# Patient Record
Sex: Male | Born: 1965 | Race: Asian | Hispanic: No | Marital: Married | Smoking: Never smoker
Health system: Southern US, Community
[De-identification: ages and names within clinical notes are randomized; demographics above are authoritative.]

---

## 2005-11-15 ENCOUNTER — Emergency Department (HOSPITAL_COMMUNITY): Admission: EM | Admit: 2005-11-15 | Discharge: 2005-11-15 | Payer: Self-pay | Admitting: Family Medicine

## 2006-01-20 ENCOUNTER — Emergency Department (HOSPITAL_COMMUNITY): Admission: EM | Admit: 2006-01-20 | Discharge: 2006-01-21 | Payer: Self-pay | Admitting: Family Medicine

## 2006-01-23 ENCOUNTER — Emergency Department (HOSPITAL_COMMUNITY): Admission: EM | Admit: 2006-01-23 | Discharge: 2006-01-23 | Payer: Self-pay | Admitting: Emergency Medicine
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
## 2014-12-07 ENCOUNTER — Emergency Department (HOSPITAL_COMMUNITY)
Admission: EM | Admit: 2014-12-07 | Discharge: 2014-12-07 | Disposition: A | Discharge status: Home / Self Care | Payer: No Typology Code available for payment source | Attending: Emergency Medicine | Admitting: Emergency Medicine

## 2014-12-07 DIAGNOSIS — S0990XA Unspecified injury of head, initial encounter: Secondary | ICD-10-CM | POA: Insufficient documentation

## 2014-12-07 DIAGNOSIS — Y998 Other external cause status: Secondary | ICD-10-CM | POA: Diagnosis not present

## 2014-12-07 DIAGNOSIS — Y92410 Unspecified street and highway as the place of occurrence of the external cause: Secondary | ICD-10-CM | POA: Diagnosis not present

## 2014-12-07 DIAGNOSIS — Y9389 Activity, other specified: Secondary | ICD-10-CM | POA: Insufficient documentation

## 2014-12-07 MED ORDER — IBUPROFEN 800 MG PO TABS
800.0000 mg | ORAL_TABLET | Freq: Once | ORAL | Status: AC
  Administered 2014-12-07: 800 mg via ORAL
  Filled 2014-12-07: qty 1

## 2014-12-07 MED ORDER — IBUPROFEN 600 MG PO TABS: 600.0000 mg | ORAL_TABLET | Freq: Four times a day (QID) | ORAL | Status: DC | PRN
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
## 2017-12-12 ENCOUNTER — Encounter (HOSPITAL_COMMUNITY): Payer: Self-pay | Admitting: Emergency Medicine

## 2017-12-12 ENCOUNTER — Other Ambulatory Visit: Payer: Self-pay

## 2017-12-12 ENCOUNTER — Emergency Department (HOSPITAL_COMMUNITY)
Admission: EM | Admit: 2017-12-12 | Discharge: 2017-12-12 | Disposition: A | Discharge status: Home / Self Care | Payer: 59 | Attending: Emergency Medicine | Admitting: Emergency Medicine

## 2017-12-12 DIAGNOSIS — Y9389 Activity, other specified: Secondary | ICD-10-CM | POA: Insufficient documentation

## 2017-12-12 DIAGNOSIS — Y33XXXA Other specified events, undetermined intent, initial encounter: Secondary | ICD-10-CM | POA: Diagnosis not present

## 2017-12-12 DIAGNOSIS — Y998 Other external cause status: Secondary | ICD-10-CM | POA: Diagnosis not present

## 2017-12-12 DIAGNOSIS — S0501XA Injury of conjunctiva and corneal abrasion without foreign body, right eye, initial encounter: Secondary | ICD-10-CM | POA: Diagnosis present

## 2017-12-12 DIAGNOSIS — Y929 Unspecified place or not applicable: Secondary | ICD-10-CM | POA: Diagnosis not present

## 2017-12-12 MED ORDER — GENTAMICIN SULFATE 0.3 % OP SOLN: 1.0000 [drp] | OPHTHALMIC | 0 refills | Status: DC

## 2017-12-12 MED ORDER — TETRACAINE HCL 0.5 % OP SOLN
1.0000 [drp] | Freq: Once | OPHTHALMIC | Status: AC
  Administered 2017-12-12: 1 [drp] via OPHTHALMIC
  Filled 2017-12-12: qty 4

## 2017-12-12 MED ORDER — FLUORESCEIN SODIUM 1 MG OP STRP
1.0000 | ORAL_STRIP | Freq: Once | OPHTHALMIC | Status: AC
  Administered 2017-12-12: 1 via OPHTHALMIC
  Filled 2017-12-12: qty 1

## 2017-12-12 NOTE — Discharge Instructions (Signed)
Apply eyedrops to right eye as prescribed every 4 hours while awake. Follow-up with the eye doctor for recheck. Return to ER for worsening or concerning symptoms.

## 2017-12-12 NOTE — ED Provider Notes (Signed)
[facility] EMERGENCY DEPARTMENT Provider Note   CSN: [id number] Arrival date & time: 12/12/17  0907     History   Chief Complaint Chief Complaint  Patient presents with  . Eye Problem    HPI Stanley Cowan is a 52 y.o. male.  52 year old male presents with complaint of right eye injury.  Patient states that he was digging up a tree yesterday when he got a bunch of dirt in his eye, feels like he still has dirt in the eye now.  Denies light sensitivity or changes in vision, does not wear glasses or contacts.  No other injuries, complaints, concerns.     History reviewed. No pertinent past medical history.  There are no active problems to display for this patient.   History reviewed. No pertinent surgical history.      Home Medications    Prior to Admission medications   Medication Sig Start Date End Date Taking? Authorizing Provider  gentamicin (GARAMYCIN) 0.3 % ophthalmic solution Place 1 drop into the right eye every 4 (four) hours. 12/12/17   Jeannie Fend, PA-C  ibuprofen (ADVIL,MOTRIN) 600 MG tablet Take 1 tablet (600 mg total) by mouth every 6 (six) hours as needed. 12/07/14   Joycie Peek, PA-C    Family History No family history on file.  Social History Social History   Tobacco Use  . Smoking status: Not on file  Substance Use Topics  . Alcohol use: Not on file  . Drug use: Not on file     Allergies   Patient has no known allergies.   Review of Systems Review of Systems  Constitutional: Negative for fever.  HENT: Negative for congestion.   Eyes: Positive for pain and redness. Negative for photophobia, discharge, itching and visual disturbance.  Skin: Negative for rash and wound.  Allergic/Immunologic: Negative for immunocompromised state.  Neurological: Negative for dizziness and headaches.  Hematological: Negative for adenopathy. Does not bruise/bleed easily.  All other systems reviewed and are negative.    Physical  Exam Updated Vital Signs BP 135/82 (BP Location: Right Arm)   Pulse (!) 59   Temp 98 F (36.7 C) (Oral)   Resp 16   SpO2 98%   Physical Exam  Constitutional: He is oriented to person, place, and time. He appears well-developed and well-nourished. No distress.  HENT:  Head: Normocephalic and atraumatic.  Eyes: Pupils are equal, round, and reactive to light. EOM are normal. Lids are everted and swept, no foreign bodies found. Right conjunctiva is injected. Right conjunctiva has no hemorrhage. Left conjunctiva is not injected. Left conjunctiva has no hemorrhage. Right eye exhibits normal extraocular motion. Left eye exhibits normal extraocular motion.  Slit lamp exam:      The right eye shows fluorescein uptake. The right eye shows no foreign body.    Pulmonary/Chest: Effort normal.  Neurological: He is alert and oriented to person, place, and time.  Skin: He is not diaphoretic.  Psychiatric: He has a normal mood and affect. His behavior is normal.  Nursing note and vitals reviewed.    ED Treatments / Results  Labs (all labs ordered are listed, but only abnormal results are displayed) Labs Reviewed - No data to display  EKG None  Radiology No results found.  Procedures Procedures (including critical care time)  Medications Ordered in ED Medications  tetracaine (PONTOCAINE) 0.5 % ophthalmic solution 1 drop (1 drop Both Eyes Given by Other 12/12/17 1037)  fluorescein ophthalmic strip 1 strip (1 strip Both  Eyes Given by Other 12/12/17 1037)     Initial Impression / Assessment and Plan / ED Course  I have reviewed the triage vital signs and the nursing notes.  Pertinent labs & imaging results that were available during my care of the patient were reviewed by me and considered in my medical decision making (see chart for details).  Clinical Course as of Dec 12 1098  Mon Dec 12, 2017  2836 52 year old male presents with complaint of right eye pain and foreign body sensation  after getting in his eye yesterday working.  Visual acuity without significant changes, watery drainage, injected conjunctiva, on Woods lamp exam patient has fluorescein uptake at the 9 o'clock position consistent with corneal abrasion, no streaming.  There is mild swelling of the right upper lid without erythema.  Patient was discharged with gentamicin drops for corneal abrasion with referral to ophthalmology for recheck follow-up.  Return to ER for any worsening or concerning symptoms.   [LM]    Clinical Course User Index [LM] Jeannie Fend, PA-C    Final Clinical Impressions(s) / ED Diagnoses   Final diagnoses:  Abrasion of right cornea, initial encounter    ED Discharge Orders         Ordered    gentamicin (GARAMYCIN) 0.3 % ophthalmic solution  Every 4 hours     12/12/17 1056           Jeannie Fend, PA-C 12/12/17 1100    Long, Arlyss Repress, MD 12/12/17 1950

## 2017-12-12 NOTE — ED Triage Notes (Signed)
Pt was working yesterday evening when he got something in his eye. Right eye is swollen shut and watering.

## 2018-09-06 ENCOUNTER — Other Ambulatory Visit: Payer: Self-pay

## 2018-09-06 ENCOUNTER — Emergency Department (HOSPITAL_COMMUNITY)
Admission: EM | Admit: 2018-09-06 | Discharge: 2018-09-06 | Disposition: A | Discharge status: Home / Self Care | Payer: 59 | Attending: Emergency Medicine | Admitting: Emergency Medicine

## 2018-09-06 DIAGNOSIS — R509 Fever, unspecified: Secondary | ICD-10-CM | POA: Insufficient documentation

## 2018-09-06 DIAGNOSIS — Z0279 Encounter for issue of other medical certificate: Secondary | ICD-10-CM | POA: Insufficient documentation

## 2018-09-06 DIAGNOSIS — R51 Headache: Secondary | ICD-10-CM | POA: Insufficient documentation

## 2018-09-06 DIAGNOSIS — Z139 Encounter for screening, unspecified: Secondary | ICD-10-CM

## 2018-09-06 NOTE — ED Provider Notes (Signed)
[facility]   CSN: [id number] Arrival date & time: 09/06/18  1240    History   Chief Complaint Chief Complaint  Patient presents with  . Fever    HPI Stanley Cowan is a 53 y.o. male.     HPI [place] interpreter used  53 year old Stanley Cowan E speaking male presents today with complaints of fever.  Patient notes he had a fever yesterday.  He is not sure how high because he did not measure his temperature but he felt hot.  He notes he had a generalized headache.  He notes taking over-the-counter medication which resolved his symptoms completely.  Patient notes no fever headache this morning.  He denies any neck stiffness, denies any cough, shortness of breath, sore throat, rhinorrhea or nasal congestion.  She was referred to the emergency room from his workplace for a work note.  No past medical history on file.  There are no active problems to display for this patient.   No past surgical history on file.      Home Medications    Prior to Admission medications   Medication Sig Start Date End Date Taking? Authorizing Provider  gentamicin (GARAMYCIN) 0.3 % ophthalmic solution Place 1 drop into the right eye every 4 (four) hours. 12/12/17   Jeannie FendMurphy, Laura A, PA-C  ibuprofen (ADVIL,MOTRIN) 600 MG tablet Take 1 tablet (600 mg total) by mouth every 6 (six) hours as needed. 12/07/14   Joycie Peekartner, Benjamin, PA-C    Family History No family history on file.  Social History Social History   Tobacco Use  . Smoking status: Not on file  Substance Use Topics  . Alcohol use: Not on file  . Drug use: Not on file     Allergies   Patient has no known allergies.   Review of Systems Review of Systems  All other systems reviewed and are negative.    Physical Exam Updated Vital Signs BP (!) 152/89 (BP Location: Left Arm)   Pulse 68   Temp 98.6 F (37 C) (Oral)   Resp 18   SpO2 99%   Physical Exam Vitals signs and nursing note  reviewed.  Constitutional:      Appearance: He is well-developed.  HENT:     Head: Normocephalic and atraumatic.  Eyes:     General: No scleral icterus.       Right eye: No discharge.        Left eye: No discharge.     Conjunctiva/sclera: Conjunctivae normal.     Pupils: Pupils are equal, round, and reactive to light.  Neck:     Musculoskeletal: Normal range of motion.     Vascular: No JVD.     Trachea: No tracheal deviation.     Comments: Neck supple full active range of motion Pulmonary:     Effort: Pulmonary effort is normal.     Breath sounds: No stridor.  Neurological:     Mental Status: He is alert and oriented to person, place, and time.     Coordination: Coordination normal.  Psychiatric:        Behavior: Behavior normal.        Thought Content: Thought content normal.        Judgment: Judgment normal.      ED Treatments / Results  Labs (all labs ordered are listed, but only abnormal results are displayed) Labs Reviewed - No data to display  EKG None  Radiology No results found.  Procedures Procedures (  including critical care time)  Medications Ordered in ED Medications - No data to display   Initial Impression / Assessment and Plan / ED Course  I have reviewed the triage vital signs and the nursing notes.  Pertinent labs & imaging results that were available during my care of the patient were reviewed by me and considered in my medical decision making (see chart for details).        Labs:   Imaging:  Consults:  Therapeutics:  Discharge Meds:   Assessment/Plan: 53 year old male is here for work note.  He has no signs or symptoms consistent with any infectious etiology.  Reports headache and subjective fever yesterday that has completely resolved.  Patient needs no further evaluation and manage at this time.  Return precautions given.   Final Clinical Impressions(s) / ED Diagnoses   Final diagnoses:  Encounter for screening    ED  Discharge Orders    None       Rosalio Loud 09/06/18 1832    Geoffery Lyons, MD 09/07/18 2132

## 2018-09-06 NOTE — ED Triage Notes (Signed)
Per translator phone- pt has had headache, fevers since yesterday. Pt states "I don't feel right" pt reports taking dayquil and ibuprofen. pt reports improvement sense.

## 2018-09-08 ENCOUNTER — Other Ambulatory Visit: Payer: Self-pay

## 2018-09-08 ENCOUNTER — Encounter (HOSPITAL_COMMUNITY): Payer: Self-pay | Admitting: Emergency Medicine

## 2018-09-08 ENCOUNTER — Emergency Department (HOSPITAL_COMMUNITY)
Admission: EM | Admit: 2018-09-08 | Discharge: 2018-09-08 | Disposition: A | Discharge status: Home / Self Care | Payer: 59 | Attending: Emergency Medicine | Admitting: Emergency Medicine

## 2018-09-08 ENCOUNTER — Emergency Department (HOSPITAL_COMMUNITY): Payer: 59

## 2018-09-08 DIAGNOSIS — M791 Myalgia, unspecified site: Secondary | ICD-10-CM | POA: Diagnosis present

## 2018-09-08 DIAGNOSIS — R509 Fever, unspecified: Secondary | ICD-10-CM | POA: Diagnosis not present

## 2018-09-08 DIAGNOSIS — U071 COVID-19: Secondary | ICD-10-CM | POA: Insufficient documentation

## 2018-09-08 LAB — CBC WITH DIFFERENTIAL/PLATELET
Abs Immature Granulocytes: 0 10*3/uL (ref 0.00–0.07)
Basophils Absolute: 0 10*3/uL (ref 0.0–0.1)
Basophils Relative: 0 %
Eosinophils Absolute: 0 10*3/uL (ref 0.0–0.5)
Eosinophils Relative: 0 %
HCT: 48.3 % (ref 39.0–52.0)
Hemoglobin: 15.8 g/dL (ref 13.0–17.0)
Immature Granulocytes: 0 %
Lymphocytes Relative: 24 %
Lymphs Abs: 0.9 10*3/uL (ref 0.7–4.0)
MCH: 26.5 pg (ref 26.0–34.0)
MCHC: 32.7 g/dL (ref 30.0–36.0)
MCV: 81 fL (ref 80.0–100.0)
Monocytes Absolute: 0.5 10*3/uL (ref 0.1–1.0)
Monocytes Relative: 14 %
Neutro Abs: 2.4 10*3/uL (ref 1.7–7.7)
Neutrophils Relative %: 62 %
Platelets: 210 10*3/uL (ref 150–400)
RBC: 5.96 MIL/uL — ABNORMAL HIGH (ref 4.22–5.81)
RDW: 12 % (ref 11.5–15.5)
WBC: 3.8 10*3/uL — ABNORMAL LOW (ref 4.0–10.5)
nRBC: 0 % (ref 0.0–0.2)

## 2018-09-08 LAB — BASIC METABOLIC PANEL
Anion gap: 11 (ref 5–15)
BUN: 9 mg/dL (ref 6–20)
CO2: 21 mmol/L — ABNORMAL LOW (ref 22–32)
Calcium: 8.9 mg/dL (ref 8.9–10.3)
Chloride: 102 mmol/L (ref 98–111)
Creatinine, Ser: 0.79 mg/dL (ref 0.61–1.24)
GFR calc Af Amer: >60 mL/min (ref >60)
GFR calc non Af Amer: >60 mL/min (ref >60)
Glucose, Bld: 96 mg/dL (ref 70–99)
Potassium: 3.8 mmol/L (ref 3.5–5.1)
Sodium: 134 mmol/L — ABNORMAL LOW (ref 135–145)

## 2018-09-08 LAB — SARS CORONAVIRUS 2: SARS Coronavirus 2: DETECTED — AB

## 2018-09-08 MED ORDER — IBUPROFEN 800 MG PO TABS
800.0000 mg | ORAL_TABLET | Freq: Once | ORAL | Status: AC
  Administered 2018-09-08: 11:00:00 800 mg via ORAL
  Filled 2018-09-08: qty 1

## 2018-09-08 NOTE — Discharge Instructions (Addendum)
Person Under Monitoring Name: Stanley Cowan  Location: [address] [place] [address]   Infection Prevention Recommendations for Individuals Confirmed to have, or Being Evaluated for, 2019 Novel Coronavirus (COVID-19) Infection Who Receive Care at Home  Individuals who are confirmed to have, or are being evaluated for, COVID-19 should follow the prevention steps below until a healthcare provider or local or state health department says they can return to normal activities.  Stay home except to get medical care You should restrict activities outside your home, except for getting medical care. Do not go to work, school, or public areas, and do not use public transportation or taxis.  Call ahead before visiting your doctor Before your medical appointment, call the healthcare provider and tell them that you have, or are being evaluated for, COVID-19 infection. This will help the healthcare providers office take steps to keep other people from getting infected. Ask your healthcare provider to call the local or state health department.  Monitor your symptoms Seek prompt medical attention if your illness is worsening (e.g., difficulty breathing). Before going to your medical appointment, call the healthcare provider and tell them that you have, or are being evaluated for, COVID-19 infection. Ask your healthcare provider to call the local or state health department.  Wear a facemask You should wear a facemask that covers your nose and mouth when you are in the same room with other people and when you visit a healthcare provider. People who live with or visit you should also wear a facemask while they are in the same room with you.  Separate yourself from other people in your home As much as possible, you should stay in a different room from other people in your home. Also, you should use a separate bathroom, if available.  Avoid sharing household items You should not share dishes,  drinking glasses, cups, eating utensils, towels, bedding, or other items with other people in your home. After using these items, you should wash them thoroughly with soap and water.  Cover your coughs and sneezes Cover your mouth and nose with a tissue when you cough or sneeze, or you can cough or sneeze into your sleeve. Throw used tissues in a lined trash can, and immediately wash your hands with soap and water for at least 20 seconds or use an alcohol-based hand rub.  Wash your [employer] your hands often and thoroughly with soap and water for at least 20 seconds. You can use an alcohol-based hand sanitizer if soap and water are not available and if your hands are not visibly dirty. Avoid touching your eyes, nose, and mouth with unwashed hands.   Prevention Steps for Caregivers and Household Members of Individuals Confirmed to have, or Being Evaluated for, COVID-19 Infection Being Cared for in the Home  If you live with, or provide care at home for, a person confirmed to have, or being evaluated for, COVID-19 infection please follow these guidelines to prevent infection:  Follow healthcare providers instructions Make sure that you understand and can help the patient follow any healthcare provider instructions for all care.  Provide for the patients basic needs You should help the patient with basic needs in the home and provide support for getting groceries, prescriptions, and other personal needs.  Monitor the patients symptoms If they are getting sicker, call his or her medical provider and tell them that the patient has, or is being evaluated for, COVID-19 infection. This will help the healthcare providers office take  steps to keep other people from getting infected. Ask the healthcare provider to call the local or state health department.  Limit the number of people who have contact with the patient If possible, have only one caregiver for the patient. Other household  members should stay in another home or place of residence. If this is not possible, they should stay in another room, or be separated from the patient as much as possible. Use a separate bathroom, if available. Restrict visitors who do not have an essential need to be in the home.  Keep older adults, very young children, and other sick people away from the patient Keep older adults, very young children, and those who have compromised immune systems or chronic health conditions away from the patient. This includes people with chronic heart, lung, or kidney conditions, diabetes, and cancer.  Ensure good ventilation Make sure that shared spaces in the home have good air flow, such as from an air conditioner or an opened window, weather permitting.  Wash your hands often Wash your hands often and thoroughly with soap and water for at least 20 seconds. You can use an alcohol based hand sanitizer if soap and water are not available and if your hands are not visibly dirty. Avoid touching your eyes, nose, and mouth with unwashed hands. Use disposable paper towels to dry your hands. If not available, use dedicated cloth towels and replace them when they become wet.  Wear a facemask and gloves Wear a disposable facemask at all times in the room and gloves when you touch or have contact with the patients blood, body fluids, and/or secretions or excretions, such as sweat, saliva, sputum, nasal mucus, vomit, urine, or feces.  Ensure the mask fits over your nose and mouth tightly, and do not touch it during use. Throw out disposable facemasks and gloves after using them. Do not reuse. Wash your hands immediately after removing your facemask and gloves. If your personal clothing becomes contaminated, carefully remove clothing and launder. Wash your hands after handling contaminated clothing. Place all used disposable facemasks, gloves, and other waste in a lined container before disposing them with other  household waste. Remove gloves and wash your hands immediately after handling these items.  Do not share dishes, glasses, or other household items with the patient Avoid sharing household items. You should not share dishes, drinking glasses, cups, eating utensils, towels, bedding, or other items with a patient who is confirmed to have, or being evaluated for, COVID-19 infection. After the person uses these items, you should wash them thoroughly with soap and water.  Wash laundry thoroughly Immediately remove and wash clothes or bedding that have blood, body fluids, and/or secretions or excretions, such as sweat, saliva, sputum, nasal mucus, vomit, urine, or feces, on them. Wear gloves when handling laundry from the patient. Read and follow directions on labels of laundry or clothing items and detergent. In general, wash and dry with the warmest temperatures recommended on the label.  Clean all areas the individual has used often Clean all touchable surfaces, such as counters, tabletops, doorknobs, bathroom fixtures, toilets, phones, keyboards, tablets, and bedside tables, every day. Also, clean any surfaces that may have blood, body fluids, and/or secretions or excretions on them. Wear gloves when cleaning surfaces the patient has come in contact with. Use a diluted bleach solution (e.g., dilute bleach with 1 part bleach and 10 parts water) or a household disinfectant with a label that says EPA-registered for coronaviruses. To make a bleach solution  at home, add 1 tablespoon of bleach to 1 quart (4 cups) of water. For a larger supply, add  cup of bleach to 1 gallon (16 cups) of water. Read labels of cleaning products and follow recommendations provided on product labels. Labels contain instructions for safe and effective use of the cleaning product including precautions you should take when applying the product, such as wearing gloves or eye protection and making sure you have good ventilation  during use of the product. Remove gloves and wash hands immediately after cleaning.  Monitor yourself for signs and symptoms of illness Caregivers and household members are considered close contacts, should monitor their health, and will be asked to limit movement outside of the home to the extent possible. Follow the monitoring steps for close contacts listed on the symptom monitoring form.   ? If you have additional questions, contact your local health department or call the epidemiologist on call at [phone] (available 24/7). ? This guidance is subject to change. For the most up-to-date guidance from [facility], please refer to their website: [email or web address]

## 2018-09-08 NOTE — ED Triage Notes (Signed)
Pt in with c/o fever and generalized body aches x 3 days. C/o some sob with exertion. Seen 2 days ago for similar

## 2018-09-08 NOTE — ED Provider Notes (Signed)
[facility] EMERGENCY DEPARTMENT Provider Note   CSN: [id number] Arrival date & time: 09/08/18  0844    History   Chief Complaint Chief Complaint  Patient presents with  . Fever  . Generalized Body Aches    HPI Stanley Cowan is a 53 y.o. male who presents emergency department for viral symptoms.  Patient speaks [place] which makes gathering history difficult however professional medical translation services utilized he was seen on 09/06/2018 requesting a work note.  He has had 2 days of body aches, chills, fever at home.  He is unsure of the maximum temperature as he does not have a thermometer.  He denies cough, chest pain, shortness of breath, nausea, vomiting, diarrhea.  He has no no contacts with similar symptoms.  He denies any recent foreign travel.     HPI  History reviewed. No pertinent past medical history.  There are no active problems to display for this patient.   History reviewed. No pertinent surgical history.      Home Medications    Prior to Admission medications   Medication Sig Start Date End Date Taking? Authorizing Provider  ibuprofen (ADVIL) 200 MG tablet Take 200-400 mg by mouth every 6 (six) hours as needed for fever, headache or mild pain.   Yes [provider]  Pseudoeph-Doxylamine-DM-APAP (NYQUIL MULTI-SYMPTOM PO) Take 2 tablets by mouth at bedtime as needed (cold symptoms).   Yes [provider]  Pseudoephedrine-APAP-DM (DAYQUIL PO) Take 2 tablets by mouth 3 (three) times daily as needed (cold symptoms).   Yes [provider]    Family History No family history on file.  Social History Social History   Tobacco Use  . Smoking status: Never Smoker  . Smokeless tobacco: Never Used  Substance Use Topics  . Alcohol use: Not Currently  . Drug use: Never     Allergies   Patient has no known allergies.   Review of Systems Review of Systems  Ten systems reviewed and are negative for acute  change, except as noted in the HPI.   Physical Exam Updated Vital Signs BP 118/86   Pulse 79   Temp 99.7 F (37.6 C) (Oral)   Resp 14   Ht  (1.676 m)   Wt 70.3 kg   SpO2 99%   BMI 25.02 kg/m   Physical Exam Vitals signs and nursing note reviewed.  Constitutional:      General: He is not in acute distress.    Appearance: He is well-developed. He is not diaphoretic.  HENT:     Head: Normocephalic and atraumatic.  Eyes:     General: No scleral icterus.    Conjunctiva/sclera: Conjunctivae normal.     Pupils: Pupils are equal, round, and reactive to light.     Comments: No horizontal, vertical or rotational nystagmus  Neck:     Musculoskeletal: Normal range of motion and neck supple.     Comments: Full active and passive ROM without pain No midline or paraspinal tenderness No nuchal rigidity or meningeal signs Cardiovascular:     Rate and Rhythm: Normal rate and regular rhythm.     Heart sounds: Normal heart sounds.  Pulmonary:     Effort: Pulmonary effort is normal. No respiratory distress.     Breath sounds: Normal breath sounds. No wheezing or rales.  Abdominal:     General: Bowel sounds are normal.     Palpations: Abdomen is soft.     Tenderness: There is no abdominal tenderness. There  is no guarding or rebound.  Musculoskeletal: Normal range of motion.  Lymphadenopathy:     Cervical: No cervical adenopathy.  Skin:    General: Skin is warm and dry.     Findings: No rash.  Neurological:     Mental Status: He is alert and oriented to person, place, and time.     Cranial Nerves: No cranial nerve deficit.     Motor: No abnormal muscle tone.     Coordination: Coordination normal.     Comments: Mental Status:  Alert, oriented, thought content appropriate. Speech fluent without evidence of aphasia. Able to follow 2 step commands without difficulty.  Cranial Nerves:  II:  Peripheral visual fields grossly normal, pupils equal, round, reactive to light III,IV, VI:  ptosis not present, extra-ocular motions intact bilaterally  V,VII: smile symmetric, facial light touch sensation equal VIII: hearing grossly normal bilaterally  IX,X: midline uvula rise  XI: bilateral shoulder shrug equal and strong XII: midline tongue extension  Motor:  5/5 in upper and lower extremities bilaterally including strong and equal grip strength and dorsiflexion/plantar flexion Sensory: Pinprick and light touch normal in all extremities.  Cerebellar: normal finger-to-nose with bilateral upper extremities Gait: normal gait and balance CV: distal pulses palpable throughout   Psychiatric:        Behavior: Behavior normal.        Thought Content: Thought content normal.        Judgment: Judgment normal.      ED Treatments / Results  Labs (all labs ordered are listed, but only abnormal results are displayed) Labs Reviewed  SARS CORONAVIRUS 2 - Abnormal; Notable for the following components:      Result Value   SARS Coronavirus 2 DETECTED (*)    All other components within normal limits  BASIC METABOLIC PANEL - Abnormal; Notable for the following components:   Sodium 134 (*)    CO2 21 (*)    All other components within normal limits  CBC WITH DIFFERENTIAL/PLATELET - Abnormal; Notable for the following components:   WBC 3.8 (*)    RBC 5.96 (*)    All other components within normal limits    EKG None  Radiology No results found.  Procedures Procedures (including critical care time)  Medications Ordered in ED Medications  ibuprofen (ADVIL) tablet 800 mg (800 mg Oral Given 09/08/18 1118)     Initial Impression / Assessment and Plan / ED Course  I have reviewed the triage vital signs and the nursing notes.  Pertinent labs & imaging results that were available during my care of the patient were reviewed by me and considered in my medical decision making (see chart for details).  Clinical Course as of Sep 10 1153  Fri Sep 08, 2018  1045 WBC(!): 3.8 [AH]   1128 SARS Coronavirus 2(!) [AH]    Clinical Course User Index [AH] Arthor CaptainHarris, Gabriel Paulding, PA-C       52/y/o male with concern for COVID 19. He is pos for covid 19  I have reviewed other labs which show no acute abnormalities. He is has and afebrile with with our dyspnea or hypoxia. I have given the patient home isolation precautions and work excuse. Return precautions and isolation precautions discussed through translator. Patient expresses his understanding and agrees with plan for discharge Kimarion Mccorvey was evaluated in Emergency Department on 09/11/2018 for the symptoms described in the history of present illness. He was evaluated in the context of the global COVID-19 pandemic, which necessitated consideration that the  patient might be at risk for infection with the SARS-CoV-2 virus that causes COVID-19. Institutional protocols and algorithms that pertain to the evaluation of patients at risk for COVID-19 are in a state of rapid change based on information released by regulatory bodies including the CDC and federal and state organizations. These policies and algorithms were followed during the patient's care in the ED.   Final Clinical Impressions(s) / ED Diagnoses   Final diagnoses:  COVID-19 virus infection    ED Discharge Orders    None       Arthor Captain, PA-C 09/11/18 1155    Sabas Sous, MD 09/13/18 [phone]

## 2019-05-15 NOTE — Progress Notes (Signed)
   CHIEF COMPLAINT / HPI:  Leukopenia CBC during recent ED visit for coronavirus infection showed a leukopenia of 3.8.  He feels well today.  No complaints of acute illness.  Breathing comfortably on room air.  Screening for colon cancer He is interested in age-appropriate cancer screening.  He has no known family history of cancer.  Screening for HIV/hepatitis C He has no history of intravenous drug use.  Is currently married and sexually active.  He is interested in screening today.  Elevated blood pressure No history of hypertension.  Chart review shows no evidence of elevated blood pressure in the past.  PERTINENT  PMH / PSH: Does not currently take any medication and is not currently treated for any chronic illnesses.  Both his parents are still living without any known medical conditions.   OBJECTIVE: BP 140/70   Pulse 76   Wt 214 lb 8 oz (97.3 kg)   SpO2 97%   BMI 34.62 kg/m   General: Alert and cooperative and appears to be in no acute distress HEENT: Neck non-tender without lymphadenopathy, masses or thyromegaly Cardio: Normal S1 and S2, no S3 or S4. Rhythm is regular. No murmurs or rubs.   Pulm: Clear to auscultation bilaterally, no crackles, wheezing, or diminished breath sounds. Normal respiratory effort Abdomen: Bowel sounds normal. Abdomen soft and non-tender.  Extremities: No peripheral edema. Warm/ well perfused.  Strong radial pulse. Neuro: Cranial nerves grossly intact   ASSESSMENT / PLAN:  Screening for colon cancer -Ambulatory referral to GI for colonoscopy  Screening for HIV (human immunodeficiency virus) -Follow-up HIV  Encounter for hepatitis C screening test for low risk patient -Follow-up hepatitis C  Leukopenia Likely secondary to viral infection.  We will recheck CBC today.  Screening for hyperlipidemia -Follow-up lipid panel  Elevated blood pressure reading No known history of hypertension.  No family history of hypertension.  Chart  review does not show evidence of persistently elevated blood pressure in the past. -Return to clinic in 1 month for repeat blood pressure check   Desire for vaccination against flu and tetanus -Flu and tetanus vaccine administered today  Mirian Mo, MD [facility]

## 2019-05-16 ENCOUNTER — Ambulatory Visit (INDEPENDENT_AMBULATORY_CARE_PROVIDER_SITE_OTHER): Payer: BLUE CROSS/BLUE SHIELD | Admitting: Family Medicine

## 2019-05-16 ENCOUNTER — Encounter: Payer: Self-pay | Admitting: Family Medicine

## 2019-05-16 ENCOUNTER — Other Ambulatory Visit: Payer: Self-pay

## 2019-05-16 VITALS — BP 140/70 | HR 76 | Wt 214.5 lb

## 2019-05-16 DIAGNOSIS — Z1159 Encounter for screening for other viral diseases: Secondary | ICD-10-CM | POA: Diagnosis not present

## 2019-05-16 DIAGNOSIS — Z23 Encounter for immunization: Secondary | ICD-10-CM | POA: Diagnosis not present

## 2019-05-16 DIAGNOSIS — D72819 Decreased white blood cell count, unspecified: Secondary | ICD-10-CM

## 2019-05-16 DIAGNOSIS — Z13220 Encounter for screening for lipoid disorders: Secondary | ICD-10-CM

## 2019-05-16 DIAGNOSIS — Z1211 Encounter for screening for malignant neoplasm of colon: Secondary | ICD-10-CM | POA: Diagnosis not present

## 2019-05-16 DIAGNOSIS — Z114 Encounter for screening for human immunodeficiency virus [HIV]: Secondary | ICD-10-CM

## 2019-05-16 DIAGNOSIS — R030 Elevated blood-pressure reading, without diagnosis of hypertension: Secondary | ICD-10-CM

## 2019-05-16 NOTE — Assessment & Plan Note (Signed)
Ambulatory referral to GI for colonoscopy 

## 2019-05-16 NOTE — Assessment & Plan Note (Signed)
-  Follow-up hepatitis C 

## 2019-05-16 NOTE — Addendum Note (Signed)
Addended by: Aquilla Solian on: 05/16/2019 09:57 AM   Modules accepted: Orders

## 2019-05-16 NOTE — Patient Instructions (Signed)
It was very nice to meet you today.  It looks like you are a pretty healthy person.  We will get some basic laboratory work to make sure there is nothing se need to treat.  I will let you know if there is anything abnormal.  You will get a call in the next week about having a colonoscopy done.    If everything is normal, you do not need to come back for one year.

## 2019-05-16 NOTE — Assessment & Plan Note (Signed)
Follow-up lipid panel 

## 2019-05-16 NOTE — Assessment & Plan Note (Signed)
-  Follow-up HIV 

## 2019-05-16 NOTE — Assessment & Plan Note (Signed)
No known history of hypertension.  No family history of hypertension.  Chart review does not show evidence of persistently elevated blood pressure in the past. -Return to clinic in 1 month for repeat blood pressure check

## 2019-05-16 NOTE — Assessment & Plan Note (Signed)
Likely secondary to viral infection.  We will recheck CBC today.

## 2019-05-17 LAB — LIPID PANEL
Chol/HDL Ratio: 4.8 ratio (ref 0.0–5.0)
Cholesterol, Total: 200 mg/dL — ABNORMAL HIGH (ref 100–199)
HDL: 42 mg/dL (ref >39)
LDL Chol Calc (NIH): 124 mg/dL — ABNORMAL HIGH (ref 0–99)
Triglycerides: 190 mg/dL — ABNORMAL HIGH (ref 0–149)
VLDL Cholesterol Cal: 34 mg/dL (ref 5–40)

## 2019-05-17 LAB — CBC
Hematocrit: 48.1 % (ref 37.5–51.0)
Hemoglobin: 15.9 g/dL (ref 13.0–17.7)
MCH: 26.3 pg — ABNORMAL LOW (ref 26.6–33.0)
MCHC: 33.1 g/dL (ref 31.5–35.7)
MCV: 80 fL (ref 79–97)
Platelets: 248 10*3/uL (ref 150–450)
RBC: 6.04 x10E6/uL — ABNORMAL HIGH (ref 4.14–5.80)
RDW: 13.2 % (ref 11.6–15.4)
WBC: 4.8 10*3/uL (ref 3.4–10.8)

## 2019-05-17 LAB — HIV ANTIBODY (ROUTINE TESTING W REFLEX): HIV Screen 4th Generation wRfx: NONREACTIVE

## 2019-05-17 LAB — HEPATITIS C ANTIBODY: Hep C Virus Ab: <0.1 s/co ratio (ref 0.0–0.9)

## 2019-05-18 ENCOUNTER — Encounter: Payer: Self-pay | Admitting: Family Medicine

## 2019-05-23 ENCOUNTER — Encounter: Payer: Self-pay | Admitting: Family Medicine

## 2020-08-10 IMAGING — DX PORTABLE CHEST - 1 VIEW
1 series · 1 of 1 positions shown · non-contrast
Comparison: None.

CLINICAL DATA: Fever, cough.

EXAM:
PORTABLE CHEST 1 VIEW

[chest]
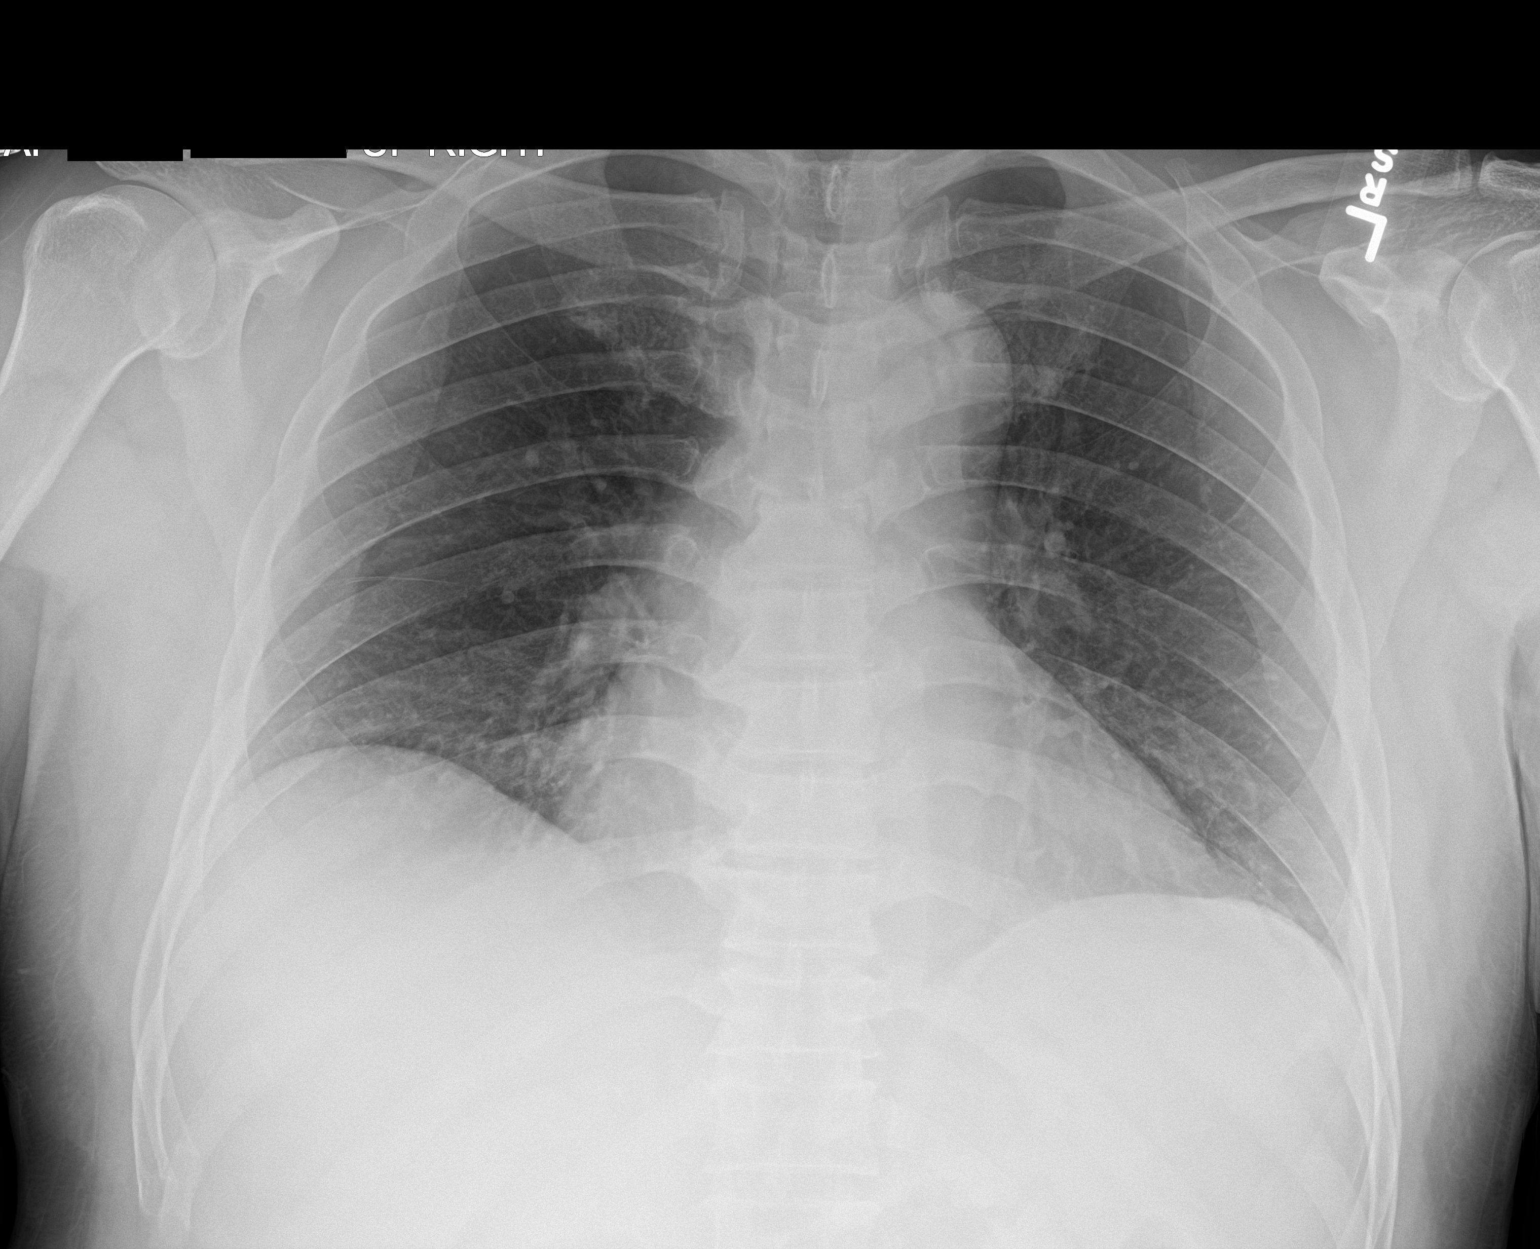

[1 of 1 positions shown; findings below may reference images not displayed]

FINDINGS: The heart size and mediastinal contours are within normal limits.
Both lungs are clear. The visualized skeletal structures are
unremarkable.
IMPRESSION: No active disease.
